# Patient Record
Sex: Male | Born: 1995 | Race: White | Hispanic: No | Marital: Married | Smoking: Current some day smoker
Health system: Southern US, Community
[De-identification: ages and names within clinical notes are randomized; demographics above are authoritative.]

## PROBLEM LIST (undated history)

## (undated) HISTORY — PX: KNEE SURGERY: SHX244

---

## 2016-09-06 ENCOUNTER — Emergency Department (HOSPITAL_COMMUNITY)
Admission: EM | Admit: 2016-09-06 | Discharge: 2016-09-06 | Disposition: A | Discharge status: Home / Self Care | Payer: Self-pay | Attending: Emergency Medicine | Admitting: Emergency Medicine

## 2016-09-06 ENCOUNTER — Emergency Department (HOSPITAL_COMMUNITY): Payer: Self-pay

## 2016-09-06 ENCOUNTER — Encounter (HOSPITAL_COMMUNITY): Payer: Self-pay | Admitting: *Deleted

## 2016-09-06 DIAGNOSIS — Y999 Unspecified external cause status: Secondary | ICD-10-CM | POA: Insufficient documentation

## 2016-09-06 DIAGNOSIS — Y9339 Activity, other involving climbing, rappelling and jumping off: Secondary | ICD-10-CM | POA: Insufficient documentation

## 2016-09-06 DIAGNOSIS — S52124A Nondisplaced fracture of head of right radius, initial encounter for closed fracture: Secondary | ICD-10-CM | POA: Insufficient documentation

## 2016-09-06 DIAGNOSIS — W1839XA Other fall on same level, initial encounter: Secondary | ICD-10-CM | POA: Insufficient documentation

## 2016-09-06 DIAGNOSIS — F17200 Nicotine dependence, unspecified, uncomplicated: Secondary | ICD-10-CM | POA: Insufficient documentation

## 2016-09-06 DIAGNOSIS — Y92481 Parking lot as the place of occurrence of the external cause: Secondary | ICD-10-CM | POA: Insufficient documentation

## 2016-09-06 MED ORDER — TRAMADOL HCL 50 MG PO TABS
100.0000 mg | ORAL_TABLET | Freq: Once | ORAL | Status: AC
  Administered 2016-09-06: 100 mg via ORAL
  Filled 2016-09-06: qty 2

## 2016-09-06 MED ORDER — IBUPROFEN 400 MG PO TABS
600.0000 mg | ORAL_TABLET | Freq: Once | ORAL | Status: AC
  Administered 2016-09-06: 600 mg via ORAL
  Filled 2016-09-06: qty 2

## 2016-09-06 MED ORDER — ACETAMINOPHEN 500 MG PO TABS
1000.0000 mg | ORAL_TABLET | Freq: Once | ORAL | Status: AC
  Administered 2016-09-06: 1000 mg via ORAL
  Filled 2016-09-06: qty 2

## 2016-09-06 MED ORDER — TRAMADOL HCL 50 MG PO TABS: 100.0000 mg | ORAL_TABLET | Freq: Four times a day (QID) | ORAL | 0 refills | Status: DC | PRN

## 2016-09-06 NOTE — Discharge Instructions (Signed)
Elevate your arm. Use ice packs over the elbow to help reduce swelling and for comfort. Wear the splint until you are rechecked by the orthopedist, Dr Romeo AppleHarrison. Call his office to get an appointment next week. Take ibuprofen 600 mg + acetaminophen 1000 mg 4 times a day for pain. Take the tramadol in addition for pain not controlled by the ibuprofen and acetaminophen.

## 2016-09-06 NOTE — ED Notes (Signed)
Pt transported to xray 

## 2016-09-06 NOTE — ED Provider Notes (Signed)
[facility]EMERGENCY DEPT Provider Note   CSN: [id number] Arrival date & time: 09/06/16  0028 By signing my name below, I, Chase Miles, attest that this documentation has been prepared under the direction and in the presence of Chase AlbeIva Beckie Viscardi, MD. Electronically Signed: Linus GalasMaharshi Miles, ED Scribe. 09/06/16. 12:47 AM.  12:46 AM  History   Chief Complaint Chief Complaint  Patient presents with  . Elbow Pain   The history is provided by the patient. No language interpreter was used.   HPI Comments: Chase Miles is a 20 y.o. male who presents to the Emergency Department complaining of right elbow injury that began prior to arrival. Pt states the he was jumping over a 5 foot vertical pole at the entrance to a [employer] when he slipped on the fabric it was covered with and landed with his arms outstretched.  Pt denies any head injury, LOC, neck pain, back pain, CP, SOB numbness, paresthesia, weakness, or any other symptoms at this time. Pt did not hit his head. Pt is right handed.   PCP none  History reviewed. No pertinent past medical history.  There are no active problems to display for this patient.  Past Surgical History:  Procedure Laterality Date  . KNEE SURGERY Left     Home Medications    Prior to Admission medications   Medication Sig Start Date End Date Taking? Authorizing Provider  traMADol (ULTRAM) 50 MG tablet Take 2 tablets (100 mg total) by mouth every 6 (six) hours as needed for severe pain. 09/06/16   Chase AlbeIva Mishelle Hassan, MD   Family History History reviewed. No pertinent family history.  Social History Social History  Substance Use Topics  . Smoking status: Current Some Day Smoker  . Smokeless tobacco: Never Used  . Alcohol use No  unemployed  Allergies   Patient has no known allergies.  Review of Systems Review of Systems  Respiratory: Negative for shortness of breath.   Cardiovascular: Negative for chest pain.  Gastrointestinal: Negative for nausea and vomiting.    Musculoskeletal: Positive for arthralgias (right elbow). Negative for back pain and neck pain.  Neurological: Negative for syncope, weakness and numbness.  All other systems reviewed and are negative.  Physical Exam Updated Vital Signs BP 101/71 (BP Location: Left Arm)   Pulse 97   Temp 97.7 F (36.5 C) (Oral)   Resp 18   Ht 5' 10.5" (1.791 m)   Wt 135 lb (61.2 kg)   SpO2 97%   BMI 19.10 kg/m   Vital signs normal    Physical Exam  Constitutional: He is oriented to person, place, and time. He appears well-developed and well-nourished.  Non-toxic appearance. He does not appear ill. No distress.  HENT:  Head: Normocephalic and atraumatic.  Right Ear: External ear normal.  Left Ear: External ear normal.  Nose: Nose normal.  Mouth/Throat: Mucous membranes are normal.  Eyes: Conjunctivae and EOM are normal.  Neck: Normal range of motion and full passive range of motion without pain.  Cardiovascular: Normal rate.   Pulmonary/Chest: Effort normal. No respiratory distress. He has no rhonchi. He exhibits no crepitus.  Abdominal: Normal appearance.  Musculoskeletal: He exhibits edema and tenderness.  Fluid  Noted in the right elbow joint. No TTP to the right shoulder, upper arm, forearm, and wrist. Intact dorsi- flexion and extension of the wrist. Sensation intact, pulses intact.   Neurological: He is alert and oriented to person, place, and time. He has normal strength. No cranial nerve deficit.  Skin: Skin is warm,  dry and intact. No rash noted. No erythema. No pallor.  Psychiatric: He has a normal mood and affect. His speech is normal and behavior is normal. His mood appears not anxious.  Nursing note and vitals reviewed.  ED Treatments / Results  DIAGNOSTIC STUDIES: Oxygen Saturation is 97% on room air, normal by my interpretation.     Labs  Radiology Dg Elbow Complete Right  Result Date: 09/06/2016 CLINICAL DATA:  Slip and fall injury. Elbow pain with limited range of  motion. EXAM: RIGHT ELBOW - COMPLETE 3+ VIEW COMPARISON:  None. FINDINGS: There is a a right elbow effusion with elevation of the anterior and posterior fat pads. Oblique linear lucency with focal cortical irregularity along the medial aspect of the right radial head and neck consistent with nondisplaced fracture. No definite articular involvement. No dislocation. Distal humerus and proximal ulna appear intact. IMPRESSION: Acute nondisplaced fracture involving the right radial head and neck with associated effusion in the elbow. Electronically Signed   By: Burman NievesWilliam  Stevens M.D.   On: 09/06/2016 01:25    Procedures Procedures (including critical care time)  Medications Ordered in ED Medications - No data to display   Initial Impression / Assessment and Plan / ED Course  I have reviewed the triage vital signs and the nursing notes.  Pertinent labs & imaging results that were available during my care of the patient were reviewed by me and considered in my medical decision making (see chart for details).  Clinical Course    COORDINATION OF CARE: 12:56 AM Discussed treatment plan including radiology studies. with pt at bedside. Pt agreed to plan but refused pain medications.   After reviewing his x-rays patient was placed in a posterior splint. His x-ray results were discussed with the patient. He was advised to follow up with orthopedics.  Final Clinical Impressions(s) / ED Diagnoses   Final diagnoses:  Closed nondisplaced fracture of head of right radius, initial encounter    New Prescriptions New Prescriptions   TRAMADOL (ULTRAM) 50 MG TABLET    Take 2 tablets (100 mg total) by mouth every 6 (six) hours as needed for severe pain.     Plan discharge  Chase AlbeIva Chase Sahakian, MD, FACEP   I personally performed the services described in this documentation, which was scribed in my presence. The recorded information has been reviewed and considered.  Chase AlbeIva Diany Formosa, MD, FACEPDevoria Albe'   Chase [place]quot,  MD 09/06/16 [phone]

## 2016-09-06 NOTE — ED Triage Notes (Signed)
Pt brought in by [facility] for c/o right elbow pain; pt states he was jumping over parking lot beam and fell landing on his right wrist causing him to have elbow pain

## 2018-01-01 IMAGING — DX DG ELBOW COMPLETE 3+V*R*
4 series · 4 of 4 positions shown · non-contrast
Comparison: None.

CLINICAL DATA: Slip and fall injury. Elbow pain with limited range
of motion.

EXAM:
RIGHT ELBOW - COMPLETE 3+ VIEW

[elbow ap]
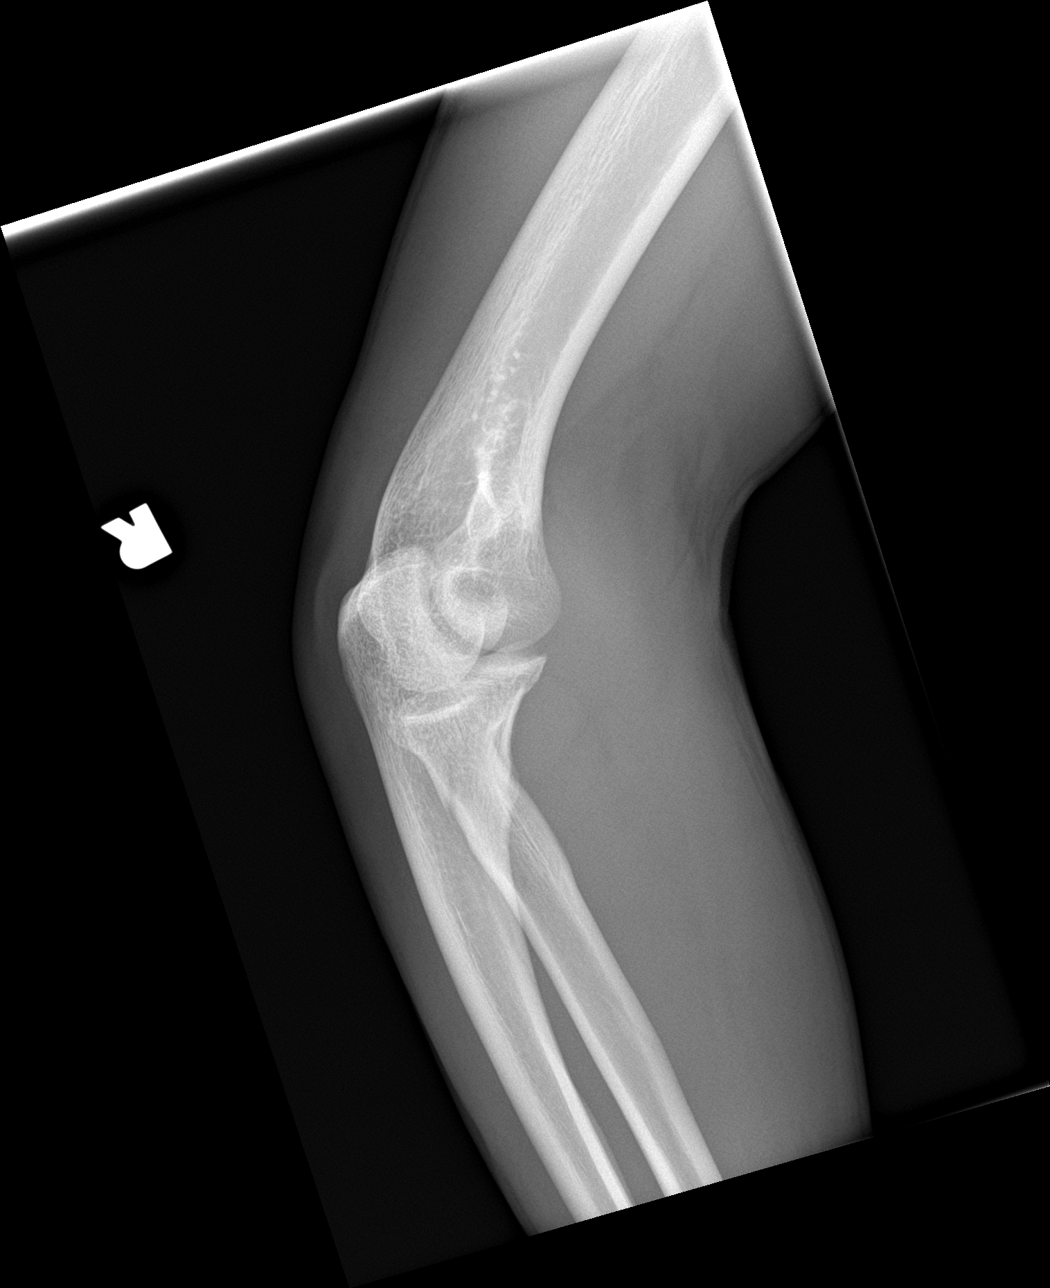

[elbow obl (1 of 2)]
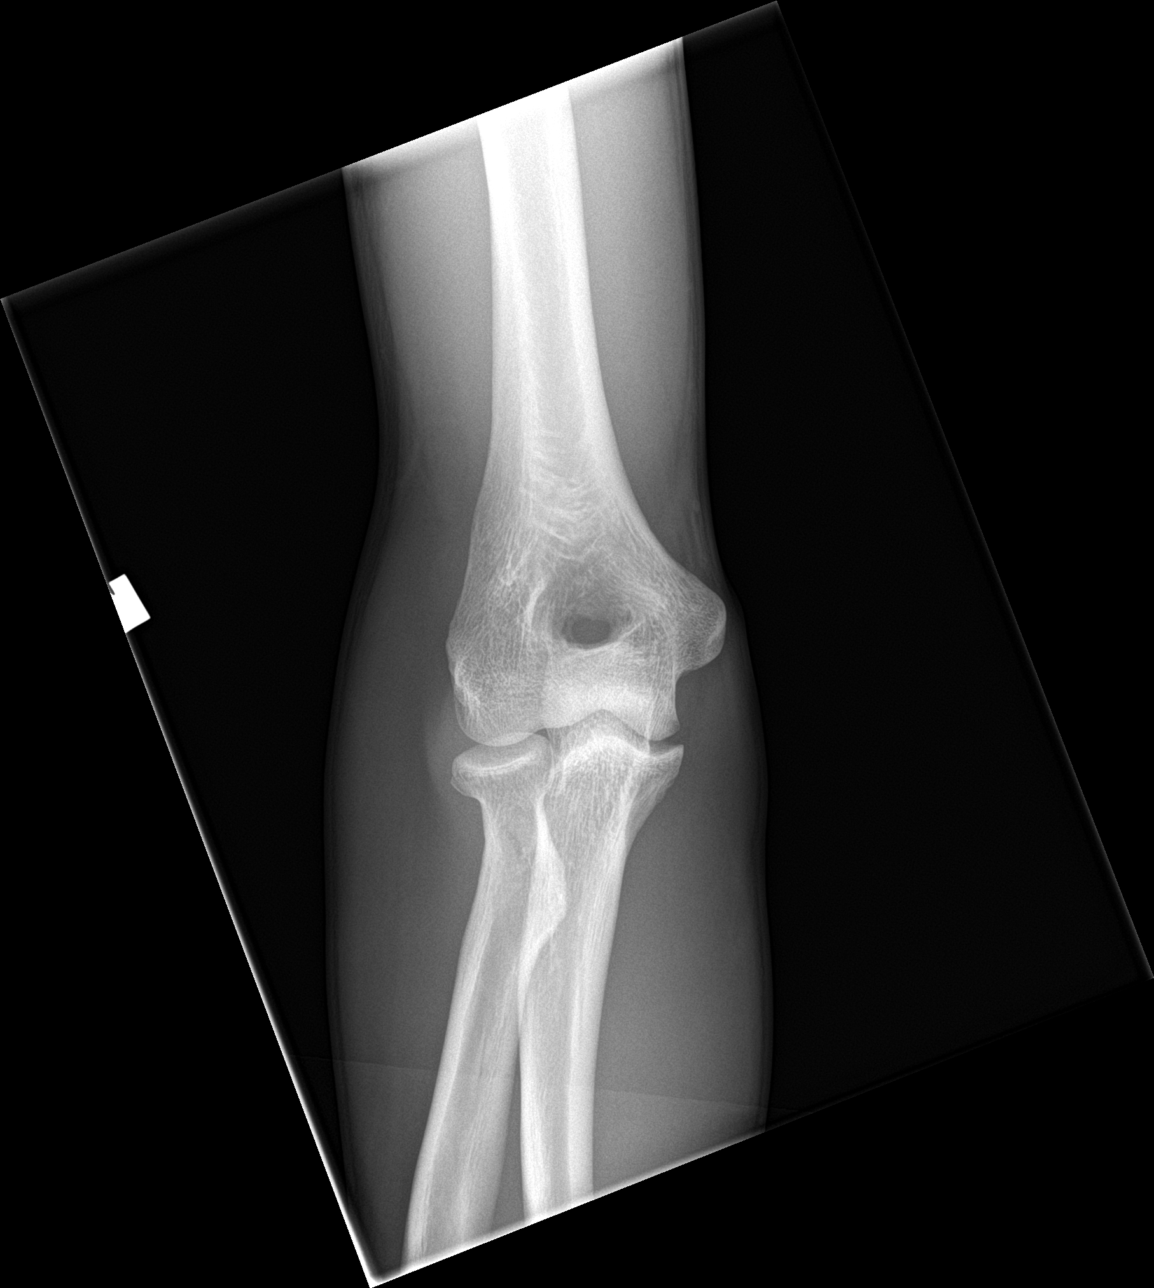

[elbow lat]
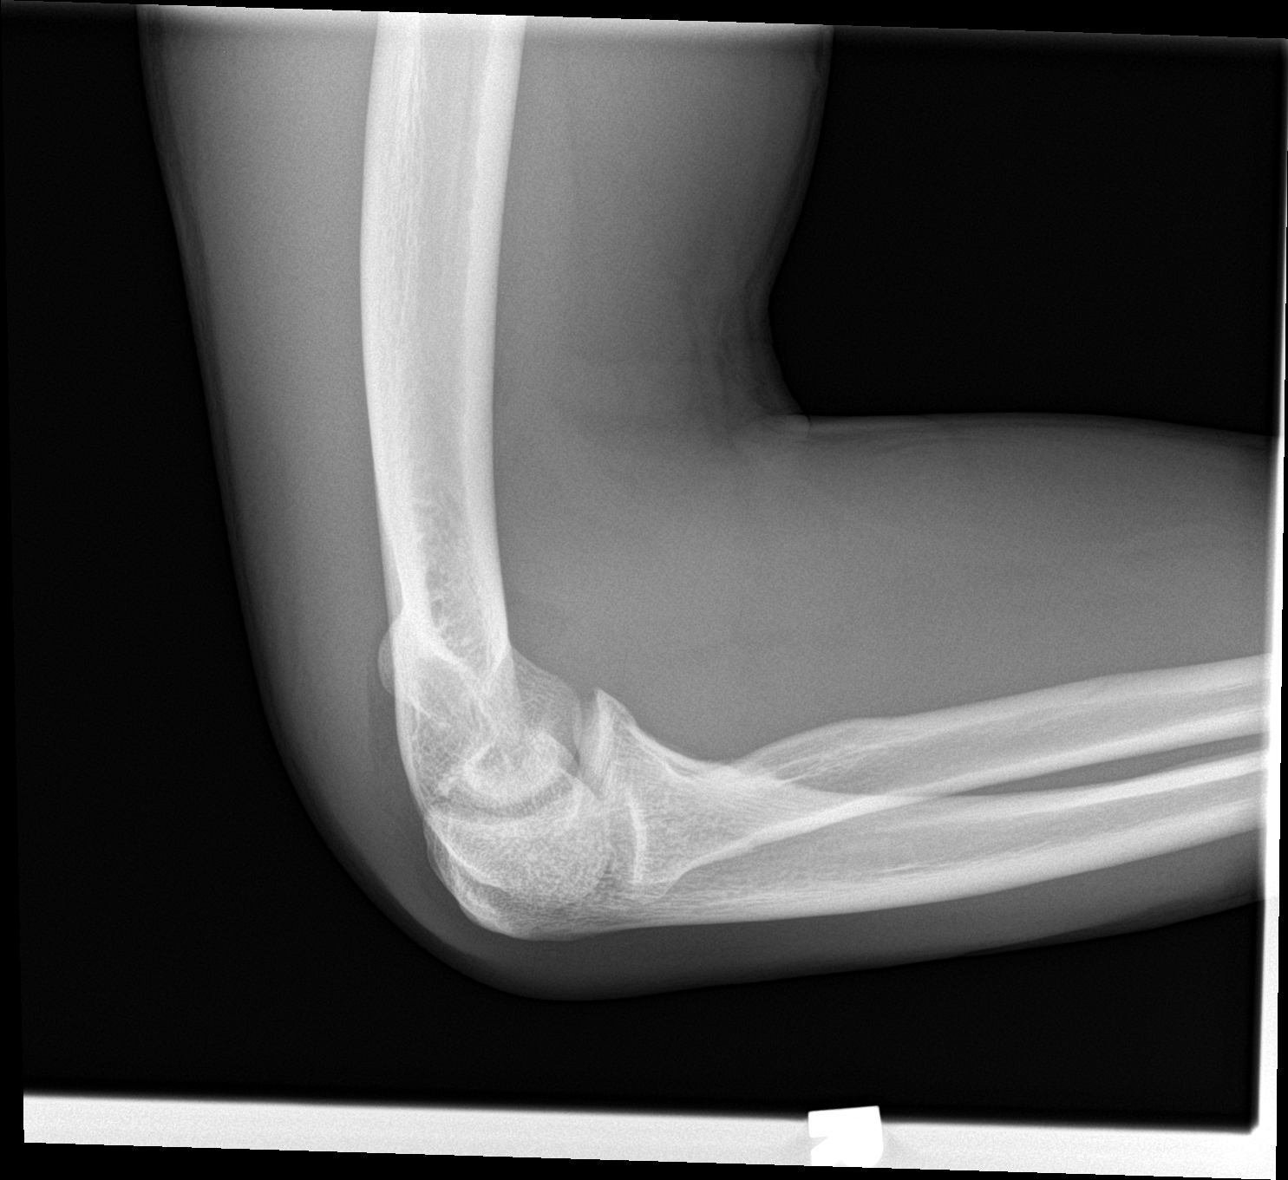

[elbow obl (2 of 2)]
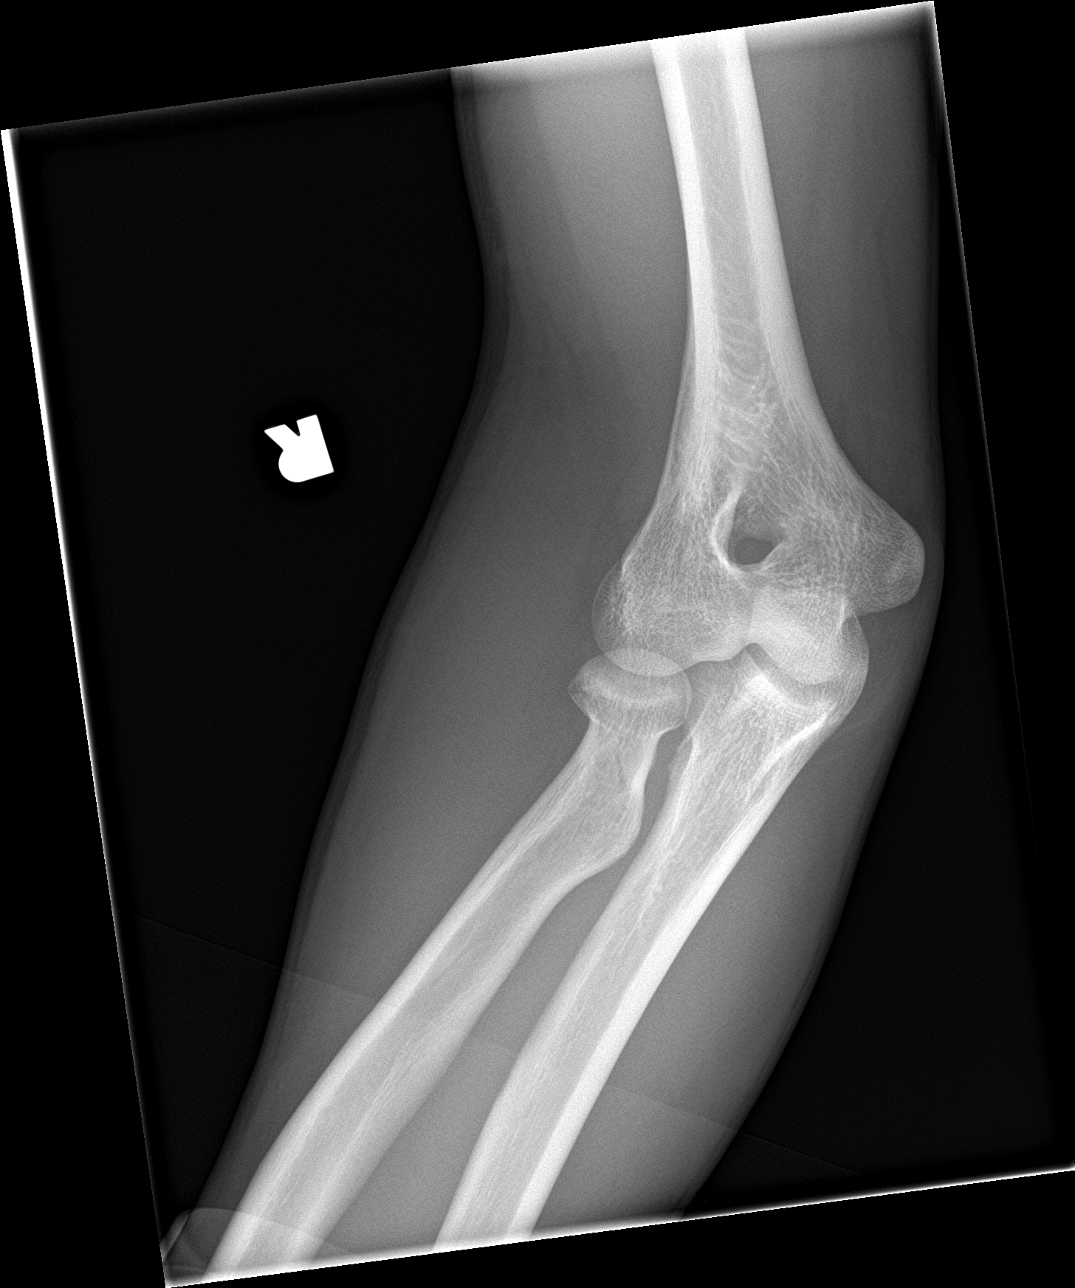

[4 of 4 positions shown; findings below may reference images not displayed]

FINDINGS: There is a a right elbow effusion with elevation of the anterior and
posterior fat pads. Oblique linear lucency with focal cortical
irregularity along the medial aspect of the right radial head and
neck consistent with nondisplaced fracture. No definite articular
involvement. No dislocation. Distal humerus and proximal ulna appear
intact.
IMPRESSION: Acute nondisplaced fracture involving the right radial head and neck
with associated effusion in the elbow.

## 2018-04-12 ENCOUNTER — Other Ambulatory Visit: Payer: Self-pay

## 2018-04-12 ENCOUNTER — Emergency Department (HOSPITAL_COMMUNITY)
Admission: EM | Admit: 2018-04-12 | Discharge: 2018-04-12 | Disposition: A | Discharge status: Home / Self Care | Payer: Self-pay | Attending: Emergency Medicine | Admitting: Emergency Medicine

## 2018-04-12 ENCOUNTER — Encounter (HOSPITAL_COMMUNITY): Payer: Self-pay | Admitting: Emergency Medicine

## 2018-04-12 DIAGNOSIS — F17210 Nicotine dependence, cigarettes, uncomplicated: Secondary | ICD-10-CM | POA: Insufficient documentation

## 2018-04-12 DIAGNOSIS — R52 Pain, unspecified: Secondary | ICD-10-CM | POA: Insufficient documentation

## 2018-04-12 LAB — CBC WITH DIFFERENTIAL/PLATELET
Basophils Absolute: 0 10*3/uL (ref 0.0–0.1)
Basophils Relative: 1 %
Eosinophils Absolute: 0.1 10*3/uL (ref 0.0–0.7)
Eosinophils Relative: 3 %
HCT: 43.2 % (ref 39.0–52.0)
Hemoglobin: 14.9 g/dL (ref 13.0–17.0)
Lymphocytes Relative: 35 %
Lymphs Abs: 1.6 10*3/uL (ref 0.7–4.0)
MCH: 30 pg (ref 26.0–34.0)
MCHC: 34.5 g/dL (ref 30.0–36.0)
MCV: 87.1 fL (ref 78.0–100.0)
Monocytes Absolute: 0.4 10*3/uL (ref 0.1–1.0)
Monocytes Relative: 8 %
Neutro Abs: 2.5 10*3/uL (ref 1.7–7.7)
Neutrophils Relative %: 53 %
Platelets: 170 10*3/uL (ref 150–400)
RBC: 4.96 MIL/uL (ref 4.22–5.81)
RDW: 11.6 % (ref 11.5–15.5)
WBC: 4.6 10*3/uL (ref 4.0–10.5)

## 2018-04-12 LAB — BASIC METABOLIC PANEL
Anion gap: 6 (ref 5–15)
BUN: 13 mg/dL (ref 6–20)
CO2: 31 mmol/L (ref 22–32)
Calcium: 9.3 mg/dL (ref 8.9–10.3)
Chloride: 102 mmol/L (ref 98–111)
Creatinine, Ser: 1.14 mg/dL (ref 0.61–1.24)
GFR calc Af Amer: >60 mL/min (ref >60)
GFR calc non Af Amer: >60 mL/min (ref >60)
Glucose, Bld: 81 mg/dL (ref 70–99)
Potassium: 3.3 mmol/L — ABNORMAL LOW (ref 3.5–5.1)
Sodium: 139 mmol/L (ref 135–145)

## 2018-04-12 LAB — CK: Total CK: 234 U/L (ref 49–397)

## 2018-04-12 MED ORDER — SODIUM CHLORIDE 0.9 % IV BOLUS
1000.0000 mL | Freq: Once | INTRAVENOUS | Status: AC
  Administered 2018-04-12: 1000 mL via INTRAVENOUS

## 2018-04-12 MED ORDER — DOXYCYCLINE HYCLATE 100 MG PO CAPS: 100.0000 mg | ORAL_CAPSULE | Freq: Two times a day (BID) | ORAL | 0 refills | Status: DC

## 2018-04-12 NOTE — ED Notes (Signed)
Pt mother to desk and states that they "really have to leave"  DC instructions have been written by EDP Reviewed with pt mother after she reports that they have to leave now due to disabled spouse.   DC'd without questions

## 2018-04-12 NOTE — Discharge Instructions (Signed)
Follow-up with your PCP.

## 2018-04-12 NOTE — ED Triage Notes (Signed)
Patient c/o generalized body aches/joint pain, fatigue, nausea, and fevers that started approx 3 weeks ago. Patient reports removing 25 ticks from body 1 month. Denies any vomiting or diarrhea.

## 2018-04-12 NOTE — ED Notes (Signed)
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

## 2018-04-13 NOTE — ED Provider Notes (Signed)
[facility]ENC[facility] DEPART[facility] Provider Note   CSN: [id number] Arrival date & time: 04/12/18  1554     History   Chief Complaint Chief Complaint  Patient presents with  . Generalized Body Aches    HPI Chase Sabinssaiah Krizek is a 22 y.o. male.  HPI   22 year old male with generalized body aches.  Onset several weeks ago.  Began about a week after removing numerous ticks from his body.  He estimates approximately 15 to 20 years.  He feels generally tired.  No energy.  Pain in multiple joints.  No swelling.  No rash.  No fevers.  No dizziness or lightheadedness.  History reviewed. No pertinent past medical history.  There are no active problems to display for this patient.   Past Surgical History:  Procedure Laterality Date  . KNEE SURGERY Left         Home Medications    Prior to Admission medications   Medication Sig Start Date End Date Taking? Authorizing Provider  doxycycline (VIBRAMYCIN) 100 MG capsule Take 1 capsule (100 mg total) by mouth 2 (two) times daily. 04/12/18   Raeford RazorKohut, Riku Buttery, MD    Family History No family history on file.  Social History Social History   Tobacco Use  . Smoking status: Current Some Day Smoker    Packs/day: 0.50    Types: Cigarettes  . Smokeless tobacco: Never Used  Substance Use Topics  . Alcohol use: No  . Drug use: No     Allergies   Bean pod extract and Onion   Review of Systems Review of Systems  All systems reviewed and negative, other than as noted in HPI.  Physical Exam Updated Vital Signs BP (!) 101/58 (BP Location: Left Arm)   Pulse (!) 112   Temp 98.2 F (36.8 C) (Oral)   Resp 18   Ht 5\' 11"  (1.803 m)   Wt 62.6 kg (138 lb)   SpO2 100%   BMI 19.25 kg/m   Physical Exam  Constitutional: He appears well-developed and well-nourished. No distress.  HENT:  Head: Normocephalic and atraumatic.  Eyes: Conjunctivae are normal. Right eye exhibits no discharge. Left eye exhibits no discharge.  Neck: Neck supple.   Cardiovascular: Normal rate, regular rhythm and normal heart sounds. Exam reveals no gallop and no friction rub.  No murmur heard. Pulmonary/Chest: Effort normal and breath sounds normal. No respiratory distress.  Abdominal: Soft. He exhibits no distension. There is no tenderness.  Musculoskeletal: He exhibits no edema or tenderness.  Neurological: He is alert.  Skin: Skin is warm and dry.  Psychiatric: He has a normal mood and affect. His behavior is normal. Thought content normal.  Nursing note and vitals reviewed.    ED Treatments / Results  Labs (all labs ordered are listed, but only abnormal results are displayed) Labs Reviewed  BASIC METABOLIC PANEL - Abnormal; Notable for the following components:      Result Value   Potassium 3.3 (*)    All other components within normal limits  CK  CBC WITH DIFFERENTIAL/PLATELET  B. BURGDORFI ANTIBODIES    EKG EKG Interpretation  Date/Time:  Sunday April 12 2018 19:36:50 EDT Ventricular Rate:  56 PR Interval:    QRS Duration: 86 QT Interval:  410 QTC Calculation: 396 R Axis:   91 Text Interpretation:  Right and left arm electrode reversal, interpretation assumes no reversal Sinus rhythm Short PR interval Borderline right axis deviation ST elev, probable normal early repol pattern No previous tracing Confirmed by Anitra LauthPlunkett,  Whitney ([id number]) on 04/13/2018 9:18:56 PM   Radiology No results found.  Procedures Procedures (including critical care time)  Medications Ordered in ED Medications  sodium chloride 0.9 % bolus 1,000 mL (0 mLs Intravenous Stopped 04/12/18 2113)     Initial Impression / Assessment and Plan / ED Course  I have reviewed the triage vital signs and the nursing notes.  Pertinent labs & imaging results that were available during my care of the patient were reviewed by me and considered in my medical decision making (see chart for details).     22 year old male with generalized body aches and arthralgia.   Patient and family extremely concerned with possible Lyme.  Exam is nonfocal aside from some mild tachycardia.  Nontoxic.  No acute neurologic complaints.  EKG with normal intervals. Final Clinical Impressions(s) / ED Diagnoses   Final diagnoses:  Generalized body aches    ED Discharge Orders        Ordered    doxycycline (VIBRAMYCIN) 100 MG capsule  2 times daily     04/12/18 2048       Raeford Razor, MD 04/13/18 2333

## 2018-04-14 LAB — B. BURGDORFI ANTIBODIES: B burgdorferi Ab IgG+IgM: <0.91 {ISR} (ref 0.00–0.90)

## 2018-07-08 ENCOUNTER — Encounter (HOSPITAL_COMMUNITY): Payer: Self-pay | Admitting: *Deleted

## 2018-07-08 ENCOUNTER — Emergency Department (HOSPITAL_COMMUNITY)
Admission: EM | Admit: 2018-07-08 | Discharge: 2018-07-09 | Disposition: A | Discharge status: Home / Self Care | Payer: Self-pay | Attending: Emergency Medicine | Admitting: Emergency Medicine

## 2018-07-08 DIAGNOSIS — F17210 Nicotine dependence, cigarettes, uncomplicated: Secondary | ICD-10-CM | POA: Insufficient documentation

## 2018-07-08 DIAGNOSIS — Y929 Unspecified place or not applicable: Secondary | ICD-10-CM | POA: Insufficient documentation

## 2018-07-08 DIAGNOSIS — Y9389 Activity, other specified: Secondary | ICD-10-CM | POA: Insufficient documentation

## 2018-07-08 DIAGNOSIS — S80262A Insect bite (nonvenomous), left knee, initial encounter: Secondary | ICD-10-CM | POA: Insufficient documentation

## 2018-07-08 DIAGNOSIS — Y998 Other external cause status: Secondary | ICD-10-CM | POA: Insufficient documentation

## 2018-07-08 DIAGNOSIS — W57XXXA Bitten or stung by nonvenomous insect and other nonvenomous arthropods, initial encounter: Secondary | ICD-10-CM | POA: Insufficient documentation

## 2018-07-08 NOTE — ED Triage Notes (Signed)
Pt with possible spider bite to left knee, noted this morning and has continued to get worse, now per pt that ambulation hurts too much.  Pt denies fever or N/V, c/o fatigue and took a nap earlier today.

## 2018-07-08 NOTE — ED Provider Notes (Signed)
[facility] EMERGENCY DEPARTMENT Provider Note   CSN: [id number] Arrival date & time: 07/08/18  2227     History   Chief Complaint Chief Complaint  Patient presents with  . Insect Bite    HPI Chase Miles is a 22 y.o. male.  Patient is a 22 year old male who presents to the emergency department with a complaint of an insect bite to the left knee.  The patient states that he is not sure what bit him.  He is concerned that it may have been a spider, but he did not see a spider around.  He has a bite to the inner aspect of the left knee.  He has some swelling present and he has pain when he puts weight on the left lower extremity.  He has not had fever or chills to be reported.  No nausea vomiting.  He has not noted red streaks going up the leg.  He does not have any immunocompromising conditions to be reported.  He presents now for assistance with this issue.  The history is provided by the patient.    History reviewed. No pertinent past medical history.  There are no active problems to display for this patient.   Past Surgical History:  Procedure Laterality Date  . KNEE SURGERY Left         Home Medications    Prior to Admission medications   Medication Sig Start Date End Date Taking? Authorizing Provider  doxycycline (VIBRAMYCIN) 100 MG capsule Take 1 capsule (100 mg total) by mouth 2 (two) times daily. 04/12/18   Raeford Razor, MD    Family History History reviewed. No pertinent family history.  Social History Social History   Tobacco Use  . Smoking status: Current Some Day Smoker    Packs/day: 0.50    Types: Cigarettes  . Smokeless tobacco: Never Used  Substance Use Topics  . Alcohol use: No  . Drug use: No     Allergies   Bean pod extract and Onion   Review of Systems Review of Systems  Constitutional: Negative for activity change.       All ROS Neg except as noted in HPI  HENT: Negative for nosebleeds.   Eyes: Negative for photophobia and  discharge.  Respiratory: Negative for cough, shortness of breath and wheezing.   Cardiovascular: Negative for chest pain and palpitations.  Gastrointestinal: Negative for abdominal pain and blood in stool.  Genitourinary: Negative for dysuria, frequency and hematuria.  Musculoskeletal: Negative for arthralgias, back pain and neck pain.  Skin: Negative.   Neurological: Negative for dizziness, seizures and speech difficulty.  Psychiatric/Behavioral: Negative for confusion and hallucinations.     Physical Exam Updated Vital Signs BP (!) 115/58 (BP Location: Right Arm)   Pulse 72   Temp 98.1 F (36.7 C) (Oral)   Ht 5\' 10"  (1.778 m)   Wt 59 kg   SpO2 100%   BMI 18.65 kg/m   Physical Exam  Constitutional: He is oriented to person, place, and time. He appears well-developed and well-nourished.  Non-toxic appearance.  HENT:  Head: Normocephalic.  Right Ear: Tympanic membrane and external ear normal.  Left Ear: Tympanic membrane and external ear normal.  Eyes: Pupils are equal, round, and reactive to light. EOM and lids are normal.  Neck: Normal range of motion. Neck supple. Carotid bruit is not present.  Cardiovascular: Normal rate, regular rhythm, normal heart sounds, intact distal pulses and normal pulses.  Pulmonary/Chest: Breath sounds normal. No respiratory distress.  Abdominal:  Soft. Bowel sounds are normal. There is no tenderness. There is no guarding.  Musculoskeletal: Normal range of motion.       Left knee: Tenderness found. Medial joint line tenderness noted.       Legs: Lymphadenopathy:       Head (right side): No submandibular adenopathy present.       Head (left side): No submandibular adenopathy present.    He has no cervical adenopathy.  Neurological: He is alert and oriented to person, place, and time. He has normal strength. No cranial nerve deficit or sensory deficit.  Skin: Skin is warm and dry.  Psychiatric: He has a normal mood and affect. His speech is  normal.  Nursing note and vitals reviewed.    ED Treatments / Results  Labs (all labs ordered are listed, but only abnormal results are displayed) Labs Reviewed - No data to display  EKG None  Radiology No results found.  Procedures Procedures (including critical care time)  Medications Ordered in ED Medications - No data to display   Initial Impression / Assessment and Plan / ED Course  I have reviewed the triage vital signs and the nursing notes.  Pertinent labs & imaging results that were available during my care of the patient were reviewed by me and considered in my medical decision making (see chart for details).       Final Clinical Impressions(s) / ED Diagnoses MDM Patient sustained a insect bite to the medial aspect of the left knee.  Pain when attempting to walk or apply weight.  The vital signs are well within normal limits.  The pulse oximetry is 100% on room air.  The patient was concerned about a spider bite.  Find no evidence of this being related to a brown recluse or black widow spider at this time.  Attempted to reassure the patient that while this still could have been a spider bite that it was not from those to more venomous spiders.  I have attempted to reassure the patient we do not see evidence of cellulitis at this time.  There is no joint effusion noted.  The patient will be treated for both possible infection and inflammation.  I have asked patient to use warm tub soaks.  Antibiotic and anti-inflammatory medication has been prescribed.  The patient is advised to see his primary physician or return to the emergency department if not improving.  The patient is in agreement with this plan.   Final diagnoses:  Insect bite of left knee, initial encounter    ED Discharge Orders         Ordered    doxycycline (VIBRAMYCIN) 100 MG capsule  2 times daily     07/09/18 0016           Ivery Quale, PA-C 07/09/18 [id number]    Glynn Octave,  MD 07/09/18 [phone]

## 2018-07-09 MED ORDER — DOXYCYCLINE HYCLATE 100 MG PO CAPS: 100.0000 mg | ORAL_CAPSULE | Freq: Two times a day (BID) | ORAL | 0 refills | Status: AC

## 2018-07-09 MED ORDER — ACETAMINOPHEN 500 MG PO TABS
1000.0000 mg | ORAL_TABLET | Freq: Once | ORAL | Status: AC
  Administered 2018-07-09: 1000 mg via ORAL
  Filled 2018-07-09: qty 2

## 2018-07-09 MED ORDER — IBUPROFEN 800 MG PO TABS
800.0000 mg | ORAL_TABLET | Freq: Once | ORAL | Status: AC
  Administered 2018-07-09: 800 mg via ORAL
  Filled 2018-07-09: qty 1

## 2018-07-09 MED ORDER — DOXYCYCLINE HYCLATE 100 MG PO TABS
100.0000 mg | ORAL_TABLET | Freq: Once | ORAL | Status: AC
  Administered 2018-07-09: 100 mg via ORAL
  Filled 2018-07-09: qty 1

## 2018-07-09 NOTE — Discharge Instructions (Signed)
Your vital signs are within normal limits. No fluid in the knee joint at this time. Please use doxycycline 2 times daily with food. Use 600mg  of ibuprofen with each meal and at bed times. Use the ace bandage for the next 5 days. Use a warm tub soak to the bite area daily for 10 to 15 min. See you MD or return to the ED if not improving.
